# Patient Record
Sex: Female | Born: 1975 | Race: White | Hispanic: No | Marital: Married
Health system: Southern US, Community
[De-identification: ages and names within clinical notes are randomized; demographics above are authoritative.]

---

## 2011-11-12 ENCOUNTER — Emergency Department: Payer: Self-pay | Admitting: Emergency Medicine

## 2011-11-12 LAB — URINALYSIS, COMPLETE
Glucose,UR: NEGATIVE mg/dL (ref 0–75)
Ketone: NEGATIVE
Leukocyte Esterase: NEGATIVE
Nitrite: NEGATIVE
Ph: 5 (ref 4.5–8.0)
Protein: NEGATIVE
RBC,UR: <1 /HPF (ref 0–5)
Specific Gravity: 1.023 (ref 1.003–1.030)

## 2011-11-12 LAB — CBC
HCT: 42.9 % (ref 35.0–47.0)
MCHC: 34.4 g/dL (ref 32.0–36.0)
MCV: 88 fL (ref 80–100)
RBC: 4.9 10*6/uL (ref 3.80–5.20)
RDW: 13.9 % (ref 11.5–14.5)
WBC: 8.4 10*3/uL (ref 3.6–11.0)

## 2011-11-19 ENCOUNTER — Emergency Department: Payer: Self-pay | Admitting: *Deleted

## 2012-05-18 ENCOUNTER — Observation Stay: Payer: Self-pay

## 2012-05-18 LAB — URINALYSIS, COMPLETE
Glucose,UR: NEGATIVE mg/dL (ref 0–75)
Nitrite: NEGATIVE
Ph: 5 (ref 4.5–8.0)
Protein: NEGATIVE
Specific Gravity: 1.021 (ref 1.003–1.030)

## 2012-05-18 LAB — PROTEIN / CREATININE RATIO, URINE
Creatinine, Urine: 133.6 mg/dL — ABNORMAL HIGH (ref 30.0–125.0)
Protein/Creat. Ratio: 97 mg/gCREAT (ref 0–200)

## 2012-05-19 ENCOUNTER — Ambulatory Visit: Payer: Self-pay | Admitting: Obstetrics and Gynecology

## 2012-06-02 ENCOUNTER — Inpatient Hospital Stay: Payer: Self-pay | Admitting: Obstetrics & Gynecology

## 2012-06-03 LAB — PIH PROFILE
Anion Gap: 6 — ABNORMAL LOW (ref 7–16)
Calcium, Total: 7.9 mg/dL — ABNORMAL LOW (ref 8.5–10.1)
Chloride: 112 mmol/L — ABNORMAL HIGH (ref 98–107)
Co2: 25 mmol/L (ref 21–32)
HCT: 34.1 % — ABNORMAL LOW (ref 35.0–47.0)
MCH: 29.6 pg (ref 26.0–34.0)
MCHC: 33.5 g/dL (ref 32.0–36.0)
Osmolality: 285 (ref 275–301)
Platelet: 163 10*3/uL (ref 150–440)
Potassium: 4 mmol/L (ref 3.5–5.1)
RBC: 3.86 10*6/uL (ref 3.80–5.20)
RDW: 13.8 % (ref 11.5–14.5)
SGOT(AST): 17 U/L (ref 15–37)
Uric Acid: 6 mg/dL (ref 2.6–6.0)
WBC: 12.9 10*3/uL — ABNORMAL HIGH (ref 3.6–11.0)

## 2012-06-03 LAB — CBC WITH DIFFERENTIAL/PLATELET
Basophil #: 0.1 10*3/uL (ref 0.0–0.1)
Basophil %: 0.4 %
Eosinophil #: 0 10*3/uL (ref 0.0–0.7)
HGB: 12.1 g/dL (ref 12.0–16.0)
Lymphocyte %: 21.8 %
MCH: 29.9 pg (ref 26.0–34.0)
MCHC: 34.4 g/dL (ref 32.0–36.0)
MCV: 87 fL (ref 80–100)
Monocyte #: 0.9 x10 3/mm (ref 0.2–0.9)
Monocyte %: 5.2 %
Neutrophil %: 72.5 %
RDW: 13.4 % (ref 11.5–14.5)

## 2012-06-06 LAB — PATHOLOGY REPORT

## 2013-08-09 IMAGING — US US OB < 14 WEEKS - US OB TV
1 series · 13 of 28 positions shown · non-contrast
Comparison: none

REASON FOR EXAM: 7wk pregnant with vaginal bleeding, [address]59 beta hcg today
COMMENTS:   LMP: > one month ago

[Series 1: us ob < 14 weeks - us ob tv · 0.26mm/px · 13 of 116 slices shown]
[im 5/116]
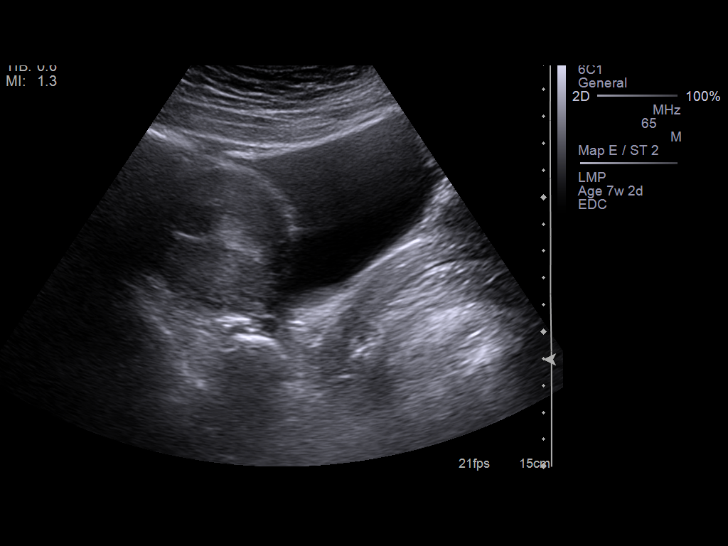
[im 13/116]
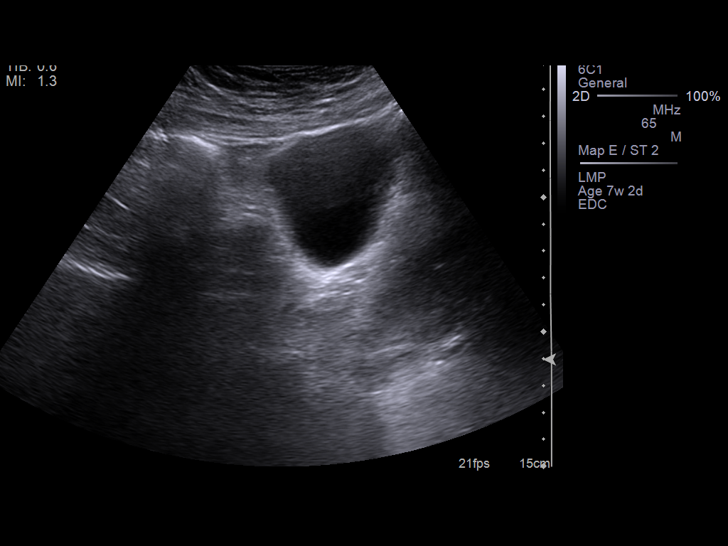
[im 22/116]
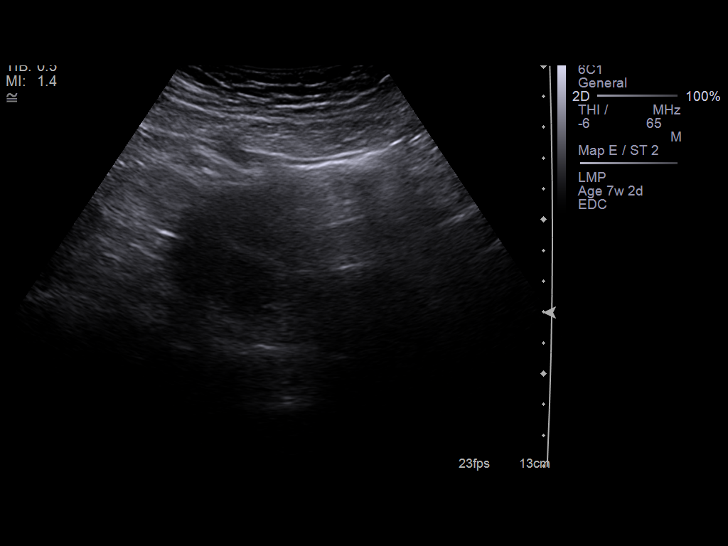
[im 30/116]
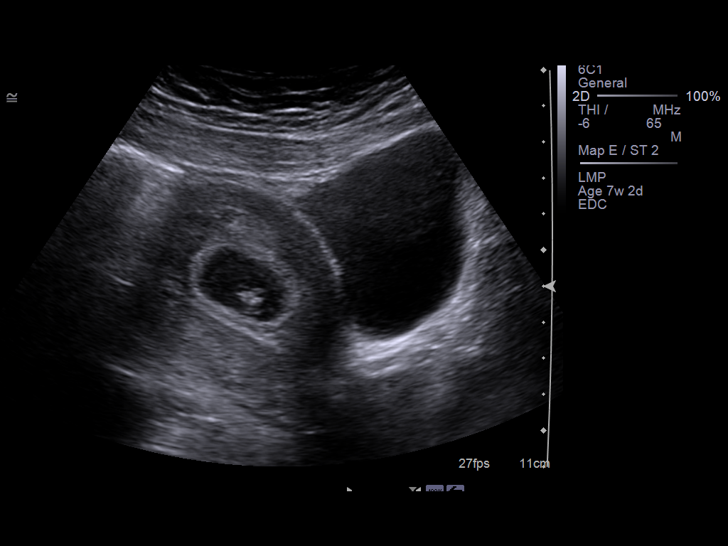
[im 39/116]
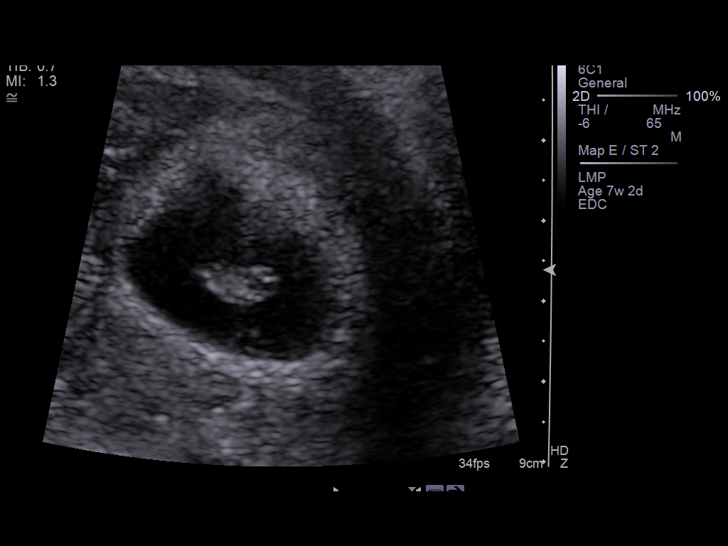
[im 47/116]
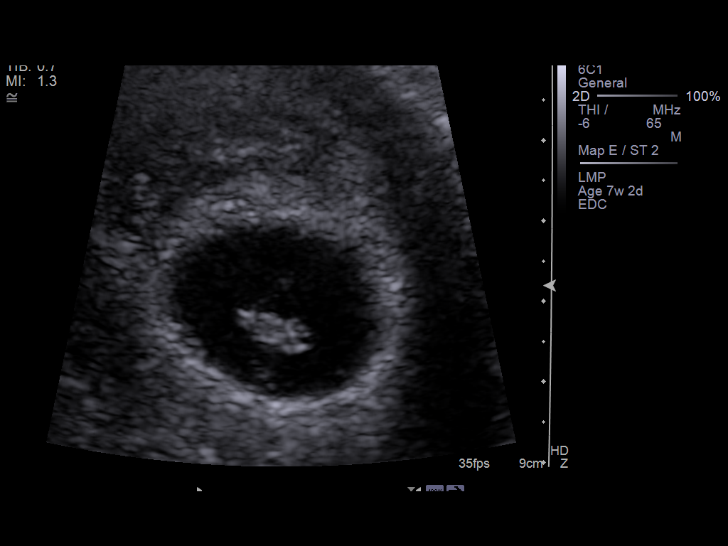
[im 60/116]
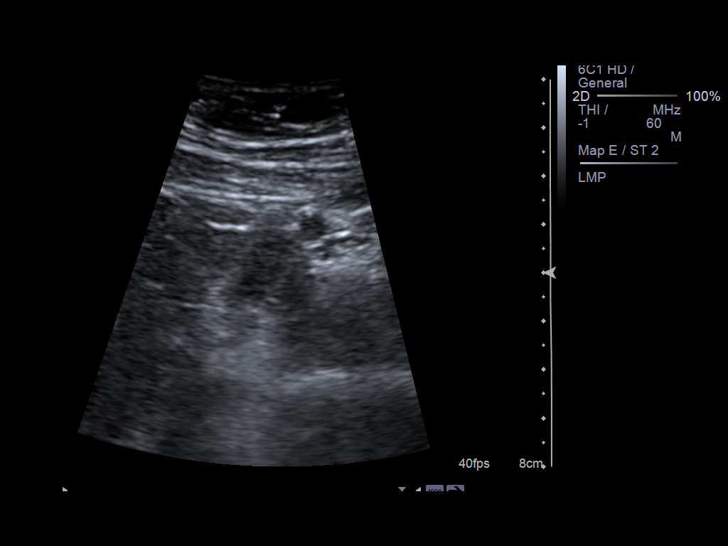
[im 69/116]
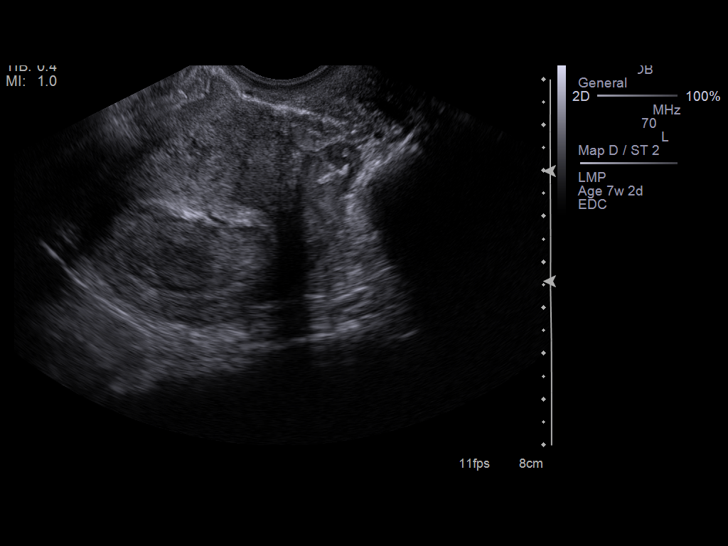
[im 77/116]
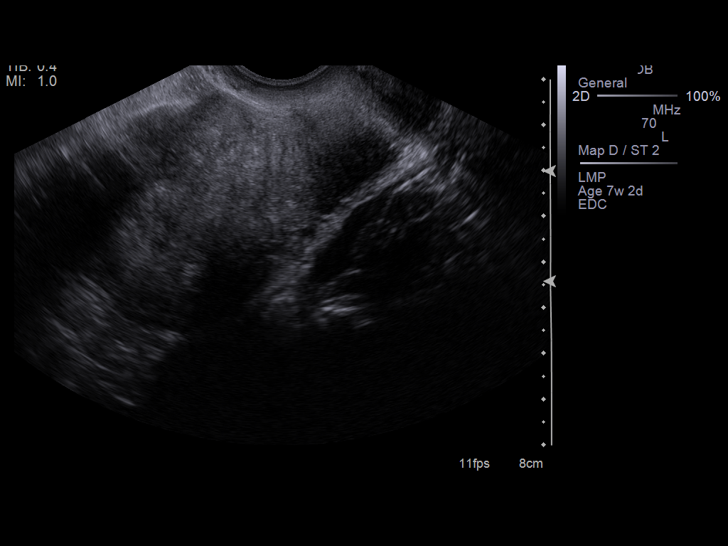
[im 86/116]
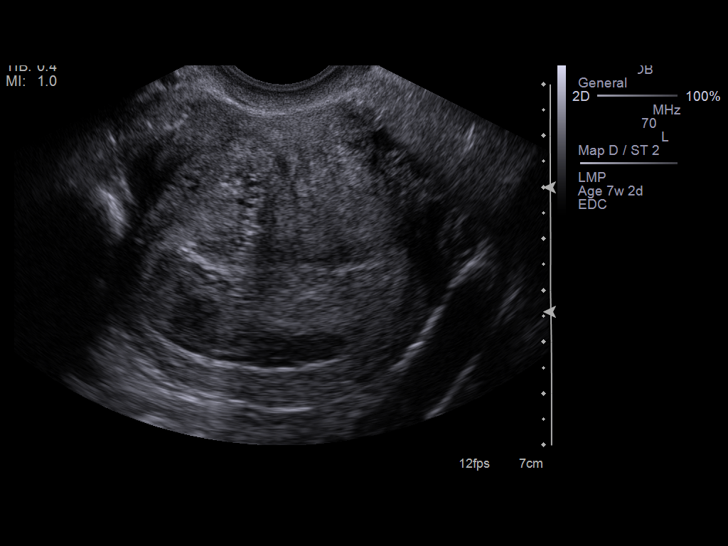
[im 94/116]
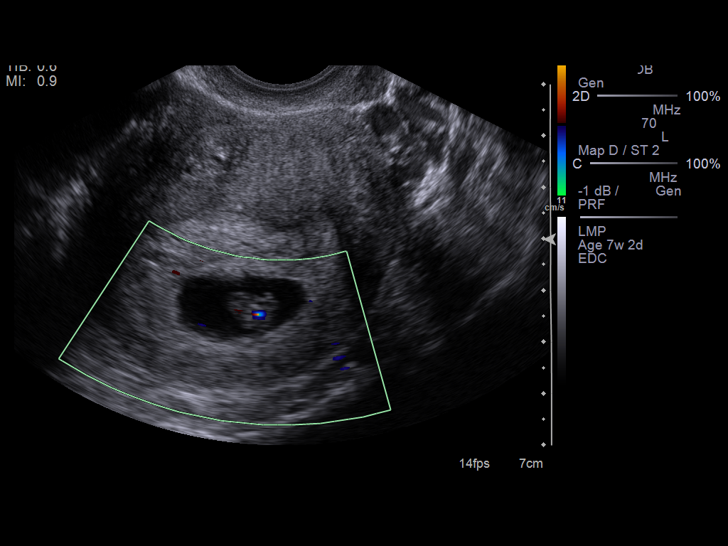
[im 103/116]
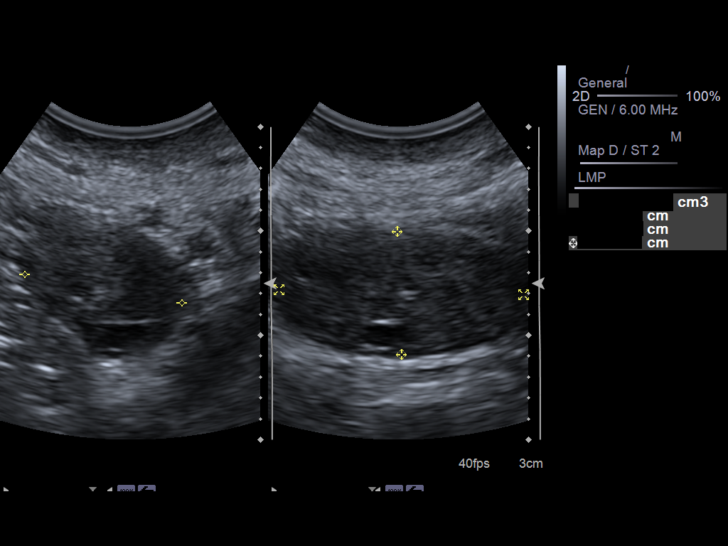
[im 111/116]
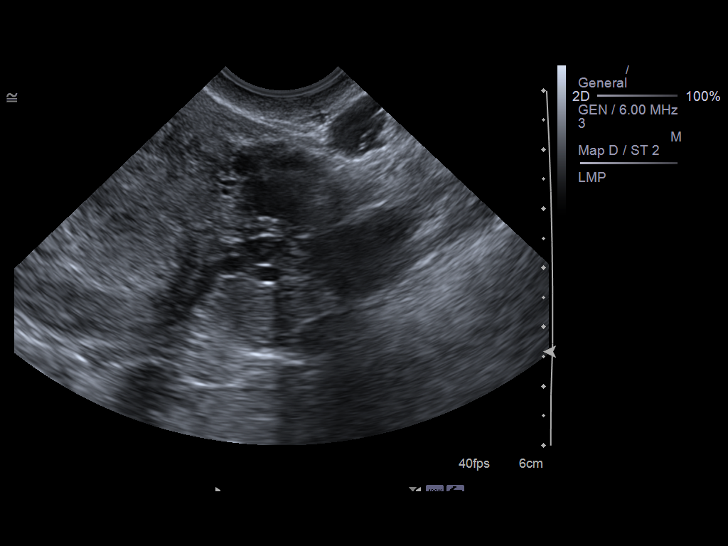

[13 of 28 positions shown; findings below may reference images not displayed]

PROCEDURE:     US  - US OB LESS THAN 14 WEEKS/W TRANS  - November 19, 2011  [DATE]

RESULT:     There is a gravid uterus. The crown-rump length of the fetus
measures 1.13 cm corresponding to 7 week 2 day gestation. The gestational
sac measures 2.39 cm corresponding to 7 week 3 day gestation. A yolk sac is
visible. A fetal cardiac rate of 140 beats per minute is demonstrated. A
subchorionic hemorrhage is again demonstrated. It appears to have increased
slightly in size since the study 12 November, 2011. Currently it measures 3.5 x
1.2 x 2 cm.

The right ovary measures 1.7 x 2.6 x 1.5 cm. The left ovary measures 2.1 x
1.7 x 0.8 centimeters. I see no adnexal masses.
IMPRESSION: There is a gravid uterus with a viable IUP and associated
subchorionic hemorrhage. The subchorionic hemorrhage is slightly increased
in size since the previous study. The estimated gestational age of the fetus
is approximately 7 weeks 2 days. The estimated date of confinement is
approximately 04 July, 2012.

A preliminary report was called to Dr. Mahtab at the conclusion of the
study.

## 2014-01-22 IMAGING — US US OB < 14 WEEKS - US OB TV
1 series · 14 of 28 positions shown · non-contrast
Comparison: none

REASON FOR EXAM: 5 weeks pregnant and bleeding
COMMENTS:

PROCEDURE:     US  - US OB LESS THAN 14 WEEKS/W TRANS  - November 12, 2011  [DATE]
RESULT:     Comparison: None.
TECHNIQUE: Multiple grayscale and color Doppler images were obtained of the
pelvis via transabdominal and endovaginal ultrasound.

[Series 1: us ob < 14 weeks - us ob tv · 0.26mm/px · 14 of 123 slices shown]
[im 5/123]
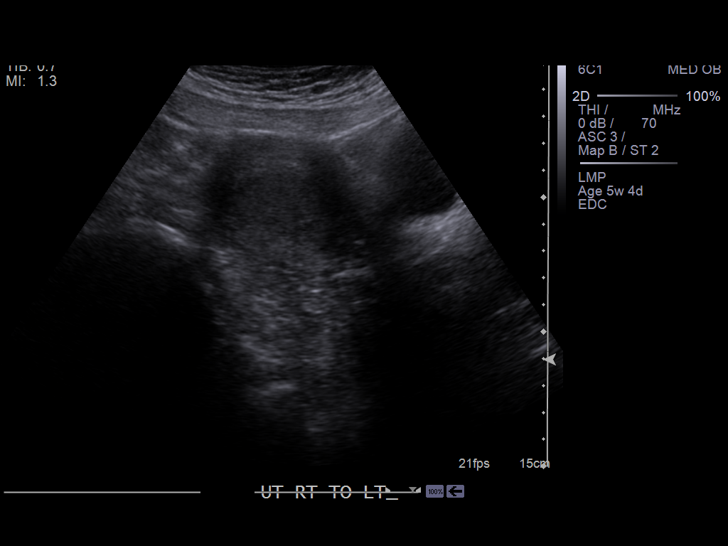
[im 14/123]
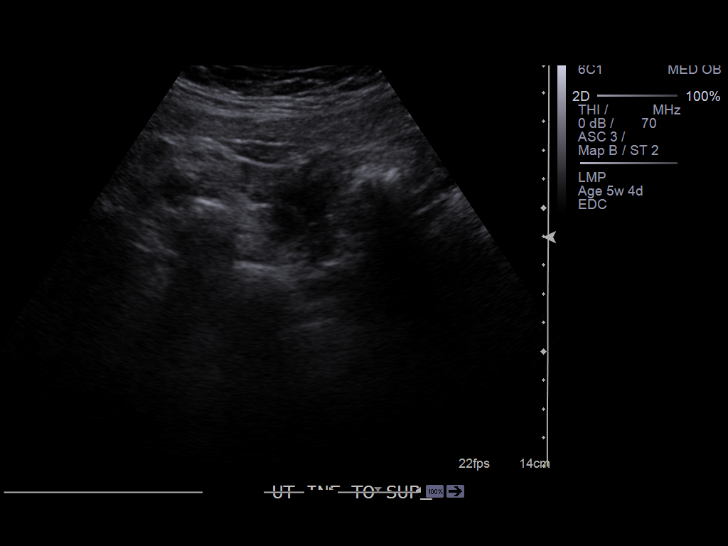
[im 23/123]
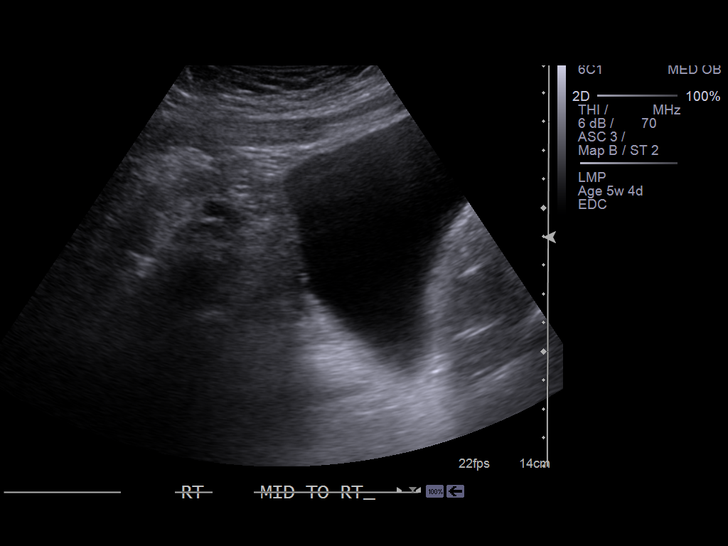
[im 32/123]
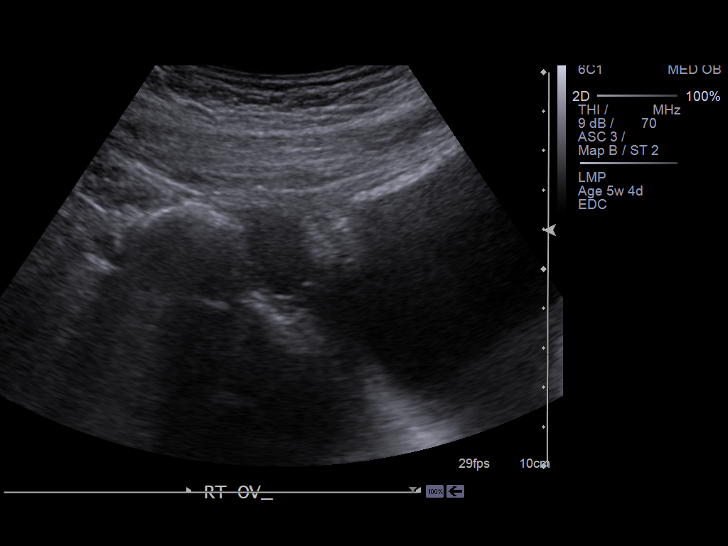
[im 41/123]
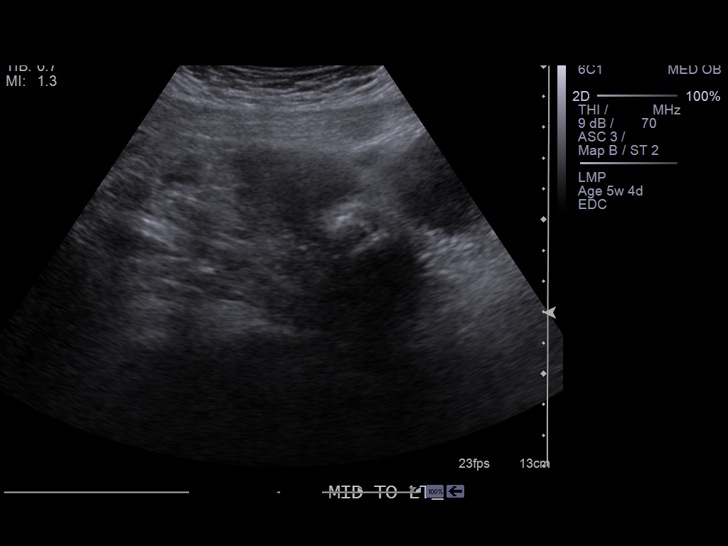
[im 50/123]
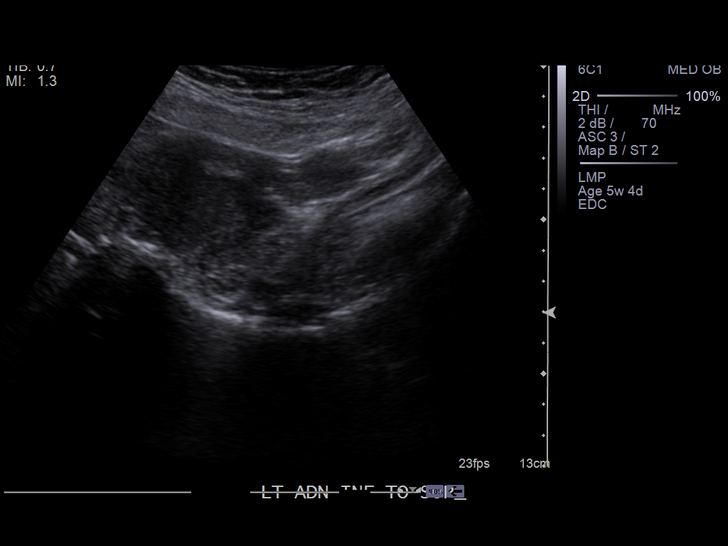
[im 59/123]
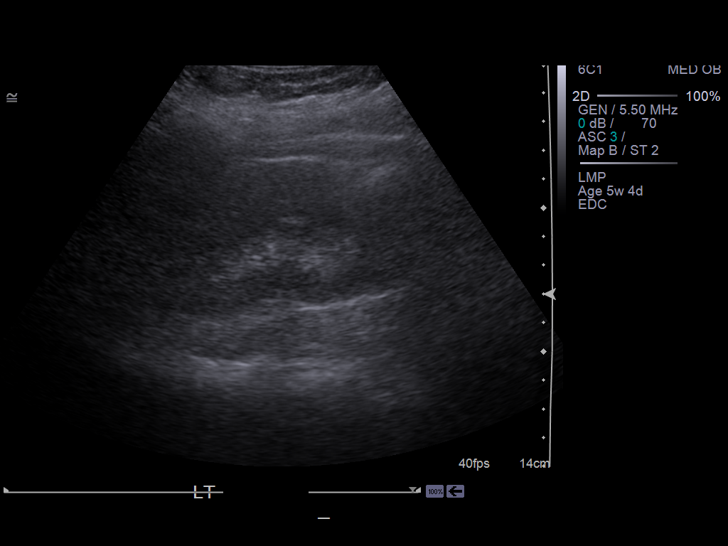
[im 68/123]
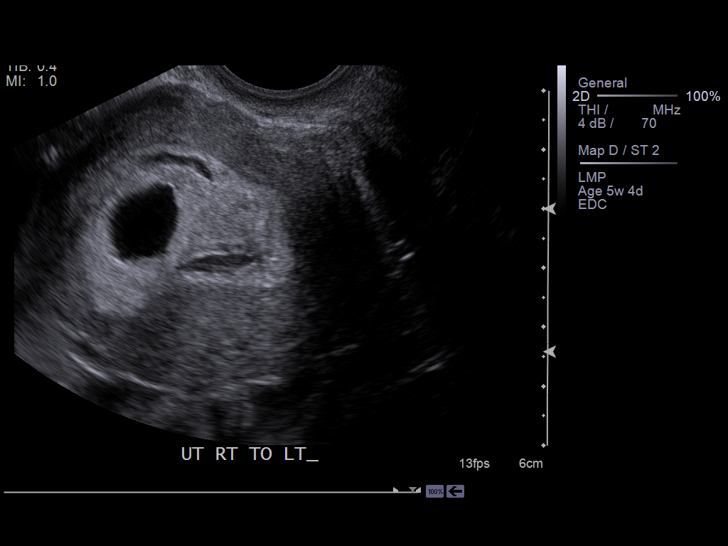
[im 77/123]
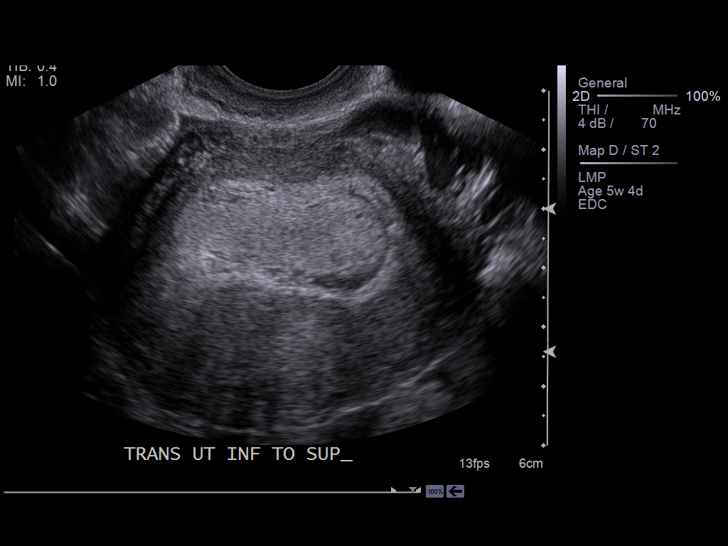
[im 86/123]
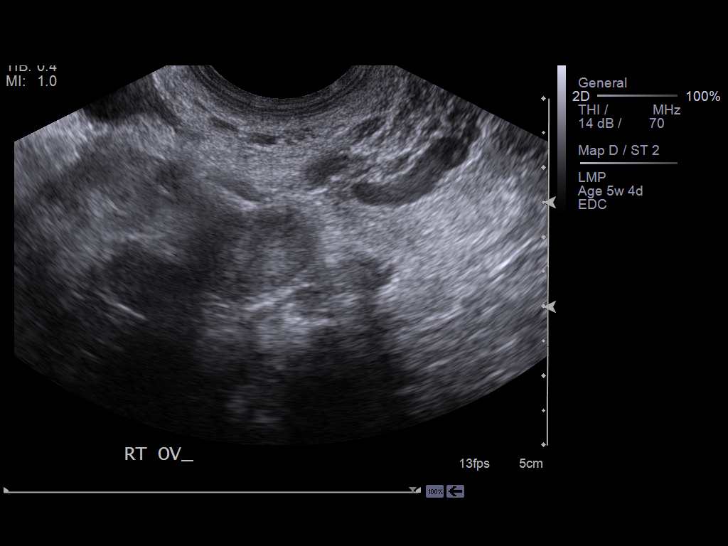
[im 95/123]
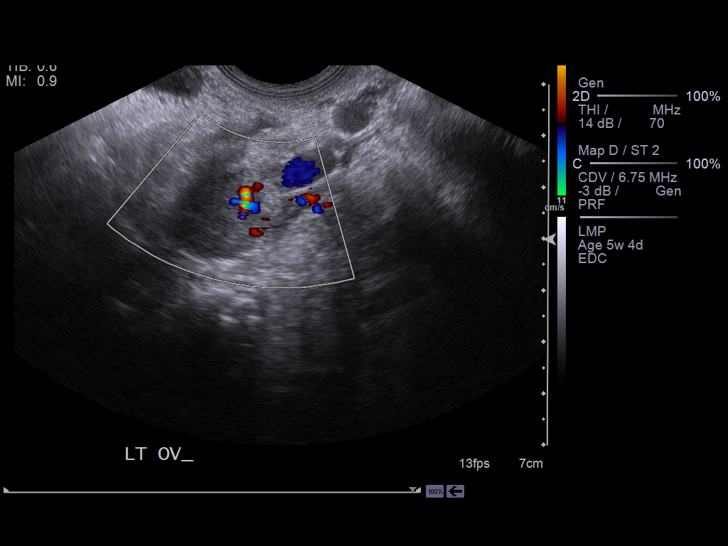
[im 104/123]
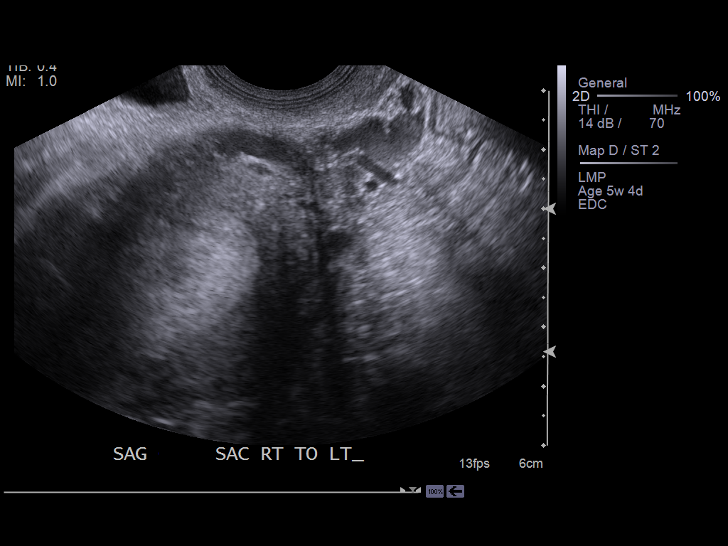
[im 113/123]
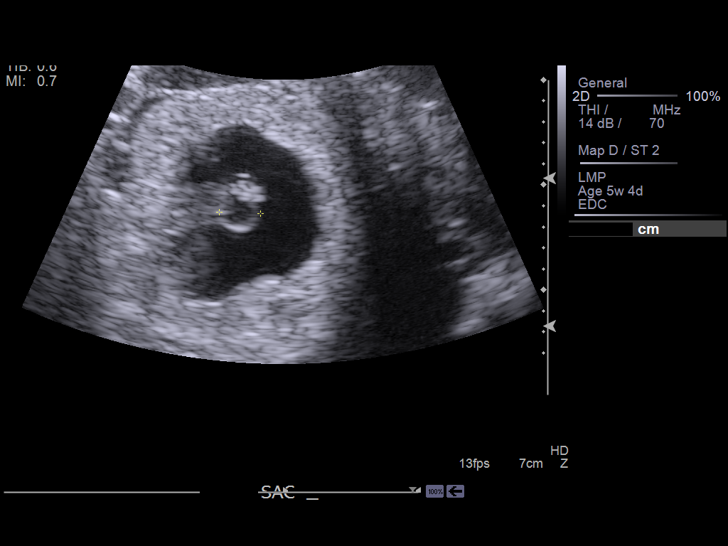
[im 123/123]
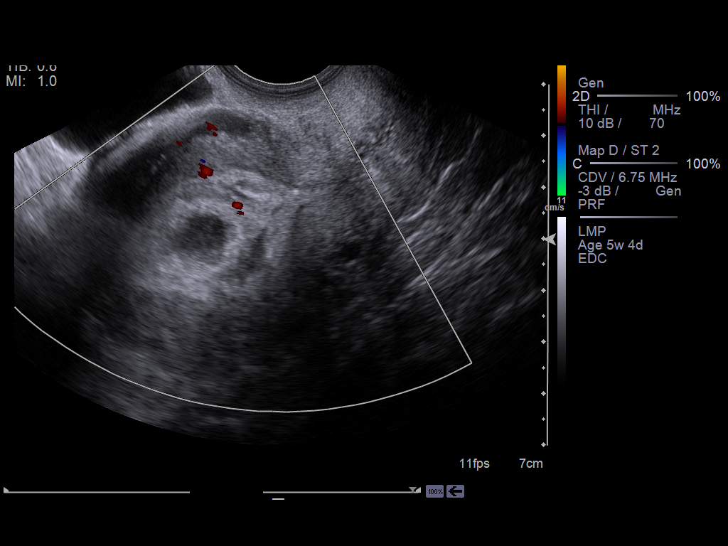

[14 of 28 positions shown; findings below may reference images not displayed]

FINDINGS: There is a gestational sac with a fetal pole within the endometrial canal. A
yolk sac is present. The crown-rump length of the fetal pole is 0.4 cm,
which correlates with estimated gestational age of 6 weeks one day. Fetal
heart rate was 111 beats per minute. There is a small subchorionic
hemorrhage adjacent to the gestational sac. It measures 1.6 x 1.5 x 1.5 cm.

The right ovary measures 2.0 x 1.2 x 1.6 cm. The left ovary measures 2.4 x
2.2 x 1.4 cm. Color Doppler flow is associated with the bilateral ovaries.
Spectral Doppler imaging was not performed. No adnexal mass identified.
IMPRESSION: Single live intrauterine pregnancy with estimated gestational age of 6 weeks
one day. There is a small subchorionic hematoma. Followup ultrasound is
suggested.

## 2014-10-23 NOTE — H&P (Signed)
L&D Evaluation:  History:   HPI 39 yo G3P2002 at 4266w2d gestational age presents to L&D in active labor.  She arrived and initial exam shows cervix at 8cm. Pregnancy notable for late entry to care and preterm labor about 2 weeks ago.  She is status post BMTZ x 2 doses.  She notes no vaginal bleeding. She notes positive fetal movement, no leakage of fluid.    Patient's Medical History No Chronic Illness    Patient's Surgical History none    Medications Pre Natal Vitamins    Allergies NKDA    Social History none    Family History Non-Contributory   ROS:   ROS All systems were reviewed.  HEENT, CNS, GI, GU, Respiratory, CV, Renal and Musculoskeletal systems were found to be normal., unless noted n HPI   Exam:   Vital Signs stable    General mod distress with contractions    Mental Status clear    Chest no evidence of respiratory distress    Abdomen gravid, tender with contractions    Fetal Position cephalic    Pelvic 8cm per RN    Mebranes Intact    FHT Description 135/mod var/+accels/no decels    Ucx regular, difficult to time   Impression:   Impression active labor, Preterm labor, GBS Unknown   Plan:   Comments admit for delivery IV fluids and ampicillin STAT for GBS unknown nursery aware prior to delivery this H&P written after delivery portion of patient's hospital stay   Electronic Signatures: Conard NovakJackson, Ossie Beltran D (MD)  (Signed 19-Dec-13 23:35)  Authored: L&D Evaluation   Last Updated: 19-Dec-13 23:35 by Conard NovakJackson, Luie Laneve D (MD)

## 2014-10-23 NOTE — H&P (Signed)
L&D Evaluation:  History Expanded:   HPI 39 yo G3P2002 with EDD of 07/01/12, presents with c/o increased discharge/leaking and regular cramping. Reports intercourse about 2 days ago. Denies VB, +FM. Denies HA or visual changes today, some spotting in vision yesterday[facility] for late entry to care at 24 weeks, US with lobular-appearing placenta (normal on recheck per pt). OB Hx: SVD 05/2001 and 08/13/2002    Blood Type (Maternal) A positive    Group B Strep Results Maternal (Result >5wks must be treated as unknown) unknown/result > 5 weeks ago    Maternal HIV Negative    Maternal Syphilis Ab Nonreactive    Maternal Varicella Immune    Rubella Results (Maternal) immune    Patient's Medical History No Chronic Illness    Patient's Surgical History none    Medications Pre Natal Vitamins    Allergies NKDA    Social History none   ROS:   ROS see HPI   Exam:   Vital Signs 157/77, 138/77, 140/80, 140/70    Urine Protein negative dipstick    General no apparent distress    Mental Status clear    Abdomen gravid, soft to palpation    Estimated Fetal Weight Average for gestational age    Pelvic no external lesions, 1/100/-2 per RN    Mebranes Intact, fern and nitrizine negative    FHT normal rate with no decels, baseline 140, moderate variability, + accels, no decels    Ucx irregular, mild    Skin dry    Other [place] negative for yeast, BV or Trich   Impression:   Impression IUP at 33 weeks with contractions   Plan:   Plan UA    Comments Recheck cervix in 1 hour Continue to monitor BP   Electronic Signatures: Terrie Grajales, Marta Lamasamara K (CNM)  (Signed 04-Dec-13 16:39)  Authored: L&D Evaluation   Last Updated: 04-Dec-13 16:39 by Vella KohlerBrothers, Bedelia Pong K (CNM)
